# Patient Record
Sex: Male | Born: 1990 | Race: White | Hispanic: No | Marital: Single | Smoking: Never smoker
Health system: Southern US, Community
[De-identification: ages and names within clinical notes are randomized; demographics above are authoritative.]

## PROBLEM LIST (undated history)

## (undated) DIAGNOSIS — J45909 Unspecified asthma, uncomplicated: Secondary | ICD-10-CM

## (undated) DIAGNOSIS — I10 Essential (primary) hypertension: Secondary | ICD-10-CM

## (undated) HISTORY — PX: TONSILLECTOMY: SUR1361

---
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
## 2019-07-28 LAB — POCT LIPID PANEL
HDL: 35
LDL: 64
Non-HDL: 98
POC Glucose: 106 mg/dl — AB (ref 70–99)
TC/HDL: 3.8
TC: 133
TRG: 167

## 2019-08-10 ENCOUNTER — Other Ambulatory Visit: Payer: Self-pay

## 2019-08-10 DIAGNOSIS — Z008 Encounter for other general examination: Secondary | ICD-10-CM

## 2019-08-10 NOTE — Progress Notes (Signed)
     Patient ID: Brett Barnett, male    DOB: [birth year], 28 y.o.   MRN: [id number]    Thank you!!  [place] [facility] [place]: [phone]  Cell:  [phone] Website: [email or web address]

## 2020-01-14 ENCOUNTER — Other Ambulatory Visit: Payer: Self-pay

## 2020-01-14 ENCOUNTER — Ambulatory Visit
Admission: EM | Admit: 2020-01-14 | Discharge: 2020-01-14 | Disposition: A | Discharge status: Home / Self Care | Payer: BC Managed Care – PPO | Attending: Emergency Medicine | Admitting: Emergency Medicine

## 2020-01-14 ENCOUNTER — Encounter: Payer: Self-pay | Admitting: Emergency Medicine

## 2020-01-14 DIAGNOSIS — L0591 Pilonidal cyst without abscess: Secondary | ICD-10-CM

## 2020-01-14 MED ORDER — DOXYCYCLINE HYCLATE 100 MG PO CAPS: 100.0000 mg | ORAL_CAPSULE | Freq: Two times a day (BID) | ORAL | 0 refills | Status: AC

## 2020-01-14 MED ORDER — IBUPROFEN 600 MG PO TABS: 600.0000 mg | ORAL_TABLET | Freq: Four times a day (QID) | ORAL | 0 refills | Status: DC | PRN

## 2020-01-14 NOTE — ED Triage Notes (Signed)
Patient c/o pain in the tailbone area that started on Monday. Denies injury.

## 2020-01-14 NOTE — Discharge Instructions (Signed)
Sitz baths or warm compresses as often as you can, take 600 mg of ibuprofen combined with the 1000 mg of Tylenol 3-4 times a day as needed for pain.  Finish the doxycycline even if you feel better.  Follow-up with surgery if this persists or gets worse.  Here is a list of primary care providers who are taking new patients:  Dr. Elizabeth Sauer, Dr. Schuyler Amor [address] [place] [address] [phone]  [facility] [address] [place] [address]  [phone]  [facility] [address] [place], [place] [address] [phone]  [facility] [address] [place]  [phone] [place], [place] [address]  Here are clinics/ other resources who will see you if you do not have insurance. Some have certain criteria that you must meet. Call them and find out what they are:  [facility]: [address], [place], [place] [address] Phone: [phone] Hours: First and Third Saturdays of each Month, 9 a.m. - 1 p.m.  [facility]: [address] [place], [place] [address] Phone: [phone] Hours: Tuesday, 4 p.m. - 8 p.m. Thursday, 1 p.m. - 8 p.m. Wednesday, 9 a.m. - [facility] [address], [place], [place] [address] Phone: [phone] Pharmacy Phone Number: [phone] Dental Phone Number: [phone] [facility] Insurance Help: [phone]  Dental Hours: Monday - Thursday, 8 a.m. - 6 p.m.  [facility] [address], [place], [place] [address] Phone: [phone] Pharmacy Phone Number: [phone] [facility] Insurance Help: [phone]  [facility] [address] [place] [place], [place] [address] Phone: [phone] Pharmacy Phone Number: [phone] [facility] Insurance Help: [phone]  [facility] [address] [place], [place] [address] Phone: [phone] [facility] Insurance Help: [phone]   [facility] [address], [place], [place] [address] Phone: [phone]  Go to www.goodrx.com to look up your medications. This will give you a list of where you can find your prescriptions at the most affordable prices. Or ask the pharmacist what the cash price is, or if they have any other discount programs available to help make your medication more affordable. This can be less expensive than what you would pay with insurance.

## 2020-01-14 NOTE — ED Provider Notes (Signed)
HPI  SUBJECTIVE:  Brett Barnett is a 29 y.o. male who presents with ...denies N/V, fevers, flank pain, abdominal pain, urinary urgency, frequency, dysuria, cloudy or odorous urine, hematuria.  No syncope. No saddle anesthesia, distal weakness/numbness, bilateral radicular leg pain/weakness, fevers/night sweats, age < 20 or > 50, mild trauma > 50, recent h/o trauma, neurological deficits,  bladder/ bowel incontinence, urinary retention, h/o CA / multiple myleoma, unexplained weight loss, pain worse at night,  h/o prolonged steroid use, h/o osteopenia, h/o IVDU, h/o HIV, recent bacteremia, known AAA. no h/o pyelonephritis, nephrolithiasis. States feels similar to previous episodes of back pain.   Family history negative for nephrolithiasis    History reviewed. No pertinent past medical history.  Past Surgical History:  Procedure Laterality Date  . TONSILLECTOMY      History reviewed. No pertinent family history.  Social History   Tobacco Use  . Smoking status: Never Smoker  . Smokeless tobacco: Never Used  Substance Use Topics  . Alcohol use: Yes  . Drug use: Never    No current facility-administered medications for this encounter. No current outpatient medications on file.  No Known Allergies   ROS  As noted in HPI.   Physical Exam  BP (!) 147/86 (BP Location: Right Arm)   Pulse 70   Temp 97.7 F (36.5 C) (Oral)   Resp 18   Ht 5\' 7"  (1.702 m)   Wt (!) 158.8 kg   SpO2 100%   BMI 54.82 kg/m   Constitutional: Well developed, well nourished, no acute distress Eyes:  EOMI, conjunctiva normal bilaterally HENT: Normocephalic, atraumatic,mucus membranes moist Respiratory: Normal inspiratory effort Cardiovascular: Normal rate GI: nondistended. No suprapubic tenderness skin: 4 x 4 centimeter area of tender erythematous induration at the top of the gluteal cleft.  No central fluctuance.  Marked area of induration with a marker for reference. Neurologic: Alert & oriented x  3, no focal neuro deficits Psychiatric: Speech and behavior appropriate   ED Course   Medications - No data to display  No orders of the defined types were placed in this encounter.   No results found for this or any previous visit (from the past 24 hour(s)). No results found.  ED Clinical Impression  1. Pilonidal cyst    *  ED Assessment/Plan   Presentation consistent with an early pilonidal cyst versus abscess.  Favor pilonidal cyst given location.  There is nothing to drain today.  Marked area of induration with a marker for reference.  Home with doxycycline 100 mg twice daily for 1 week ibuprofen 600/acetaminophen 1000 mg 3-4 times a day as needed for pain, sitz baths.  Patient to follow-up with [place] surgery if it persists.  To the ER for systemic symptoms, pain not controlled with medications or any other concerns  Discussed MDM, treatment plan, and plan for follow-up with patient. Discussed sn/sx that should prompt return to the ED. patient agrees with plan.   No orders of the defined types were placed in this encounter.   *This clinic note was created using Dragon dictation software. Therefore, there may be occasional mistakes despite careful proofreading.  ?     , MD 01/14/20 [phone]

## 2020-01-29 ENCOUNTER — Ambulatory Visit: Payer: Self-pay | Admitting: Surgery

## 2020-01-29 NOTE — H&P (View-Only) (Signed)
CC: Pilonidal disease  HPI: Brett Barnett is a very pleasant 29yoM with negative medical history who presents for evaluation of possible pilonidal disease. He reports that approximately 2 weeks ago he began having some pain in the superior most portion of the gluteal cleft/low back. This increased over the ensuing week and an approximate 4-5 days ago began spontaneously draining purulent-like fluid. The drainage has steadily decreased and almost completely stopped. He denies fever/chills. He reports his pain is much better than it was before. It was a sharp pain, now just an ache. Does not radiate. Nothing aside from spontaneous drainage seemed to make it better. He denies any history of abscess or infection in this location prior to this.  PMH: Denies  PSH: Denies  FHx: Denies FHx of colorectal, breast, endometrial, ovarian or cervical cancer  Social: He reports that he vapes; social EtOH use; denies drug use. He works as a machine operator  ROS: A comprehensive 10 system review of systems was completed with the patient and pertinent findings as noted above.  The patient is a 29 year old male.   Past Surgical History (Chanel Nolan, CMA; 01/26/2020 9:58 AM) Tonsillectomy   Diagnostic Studies History (Chanel Nolan, CMA; 01/26/2020 9:58 AM) Colonoscopy  never  Allergies (Chanel Nolan, CMA; 01/26/2020 9:58 AM) No Known Allergies  [01/26/2020]: No Known Drug Allergies  [01/26/2020]: Allergies Reconciled   Medication History (Chanel Nolan, CMA; 01/26/2020 9:59 AM) Doxycycline Hyclate (100MG Capsule, Oral) Active. Medications Reconciled  Social History (Chanel Nolan, CMA; 01/26/2020 9:58 AM) Caffeine use  Coffee. No drug use  Tobacco use  Current every day smoker.  Family History (Chanel Nolan, CMA; 01/26/2020 9:58 AM) Family history unknown  First Degree Relatives   Other Problems (Chanel Nolan, CMA; 01/26/2020 9:58 AM) Asthma  Back Pain  Chest pain   Gastroesophageal Reflux Disease     Review of Systems (Shalondra Wunschel M. Shann Lewellyn MD; 01/26/2020 10:30 AM) General Not Present- Appetite Loss, Chills and Fever. HEENT Not Present- Earache, Hearing Loss, Hoarseness, Nose Bleed, Oral Ulcers, Ringing in the Ears, Seasonal Allergies, Sinus Pain, Sore Throat, Visual Disturbances, Wears glasses/contact lenses and Yellow Eyes. Respiratory Present- Snoring. Not Present- Bloody sputum, Chronic Cough, Difficulty Breathing and Wheezing. Breast Not Present- Breast Mass, Breast Pain, Nipple Discharge and Skin Changes. Cardiovascular Present- Difficulty Breathing Lying Down. Not Present- Chest Pain, Leg Cramps, Palpitations, Rapid Heart Rate, Shortness of Breath and Swelling of Extremities. Gastrointestinal Present- Rectal Pain. Not Present- Abdominal Pain, Bloating, Bloody Stool, Change in Bowel Habits, Chronic diarrhea, Constipation, Difficulty Swallowing, Excessive gas, Gets full quickly at meals, Hemorrhoids, Indigestion, Nausea and Vomiting. Musculoskeletal Present- Back Pain. Not Present- Joint Pain, Joint Stiffness, Muscle Pain, Muscle Weakness and Swelling of Extremities. Psychiatric Not Present- Anxiety and Depression. Hematology Not Present- Abnormal Bleeding, Anemia and Blood Clots.  Vitals (Chanel Nolan CMA; 01/26/2020 9:59 AM) 01/26/2020 9:59 AM Weight: 354.5 lb Height: 68in Body Surface Area: 2.61 m Body Mass Index: 53.9 kg/m  Temp.: 98F  Pulse: 92 (Regular)        Physical Exam (Tresea Heine M. Tahiri Shareef MD; 01/26/2020 10:31 AM) The physical exam findings are as follows: Note: Constitutional: No acute distress; conversant; no deformities; wearing mask Eyes: Moist conjunctiva; no lid lag; anicteric sclerae; pupils equal and round Neck: Trachea midline; no palpable thyromegaly Lungs: Normal respiratory effort; no tactile fremitus CV: rrr; no palpable thrill; no pitting edema GI: Abdomen obese soft, nontender, nondistended; no  palpable hepatosplenomegaly Skin: Uppermost portion of gluteal cleft and low back with midline pit   and firm/indurated tissue consistent with resolving abscess cavity. No significant erythema. No active purulent drainage. No tenderness to palpation of this area. MSK: Normal gait; no clubbing/cyanosis Psychiatric: Appropriate affect; alert and oriented 3 Lymphatic: No palpable cervical or axillary lymphadenopathy **A chaperone, [employer], was present for this encounter    Assessment & Plan Cristal Deer M. 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 is a very pleasant 29yoM with pilonidal disease Impression: -The anatomy and physiology of the low back region was discussed as well as the pathophysiology of pilonidal disease was discussed with associated pictures and illustrations using the ASCRS handout. -We discussed options moving forward including observation vs excision of pilonidal disease. Risks of recurrent abscess being higher with observation but not absent with surgery either. -The procedure, material risks (including, but not limited to, pain, bleeding, infection, scarring, need for additional procedures, recurrence rates of 20-30%, pneumonia, heart attack, stroke, death) benefits and alternatives to surgery were discussed at length. I noted a good probability that the procedure would help improve his symptoms. The patient's questions were answered to his satisfaction, he voiced understanding and elected to proceed with surgery Additionally, we discussed typical postoperative expectations and the recovery process. We reviewed wound care expectations including that this would be an open wound, down to the presacral tissues and on average requires 2-3 months of wet to dry dressing application to close.  This patient encounter took 32 minutes today to perform the following: take history, perform exam, review outside records, interpret imaging, counsel the  patient on their diagnosis and document encounter, findings & plan in the EHR  Signed by Andria Meuse, MD (01/26/2020 10:43 AM)

## 2020-01-29 NOTE — H&P (Signed)
CC: Pilonidal disease  HPI: Brett Barnett is a very pleasant 24yoM with negative medical history who presents for evaluation of possible pilonidal disease. He reports that approximately 2 weeks ago he began having some pain in the superior most portion of the gluteal cleft/low back. This increased over the ensuing week and an approximate 4-5 days ago began spontaneously draining purulent-like fluid. The drainage has steadily decreased and almost completely stopped. He denies fever/chills. He reports his pain is much better than it was before. It was a sharp pain, now just an ache. Does not radiate. Nothing aside from spontaneous drainage seemed to make it better. He denies any history of abscess or infection in this location prior to this.  PMH: Denies  PSH: Denies  FHx: Denies FHx of colorectal, breast, endometrial, ovarian or cervical cancer  Social: He reports that he vapes; social EtOH use; denies drug use. He works as a Glass blower/designer  ROS: A comprehensive 10 system review of systems was completed with the patient and pertinent findings as noted above.  The patient is a 29 year old male.   Past Surgical History Brett Barnett, Brett Barnett; 01/26/2020 9:58 AM) Tonsillectomy   Diagnostic Studies [facility] Brett Barnett, CMA; 01/26/2020 9:58 AM) Colonoscopy  never  Allergies (Brett Barnett, CMA; 01/26/2020 9:58 AM) No Known Allergies  [01/26/2020]: No Known Drug Allergies  [01/26/2020]: Allergies Reconciled   Medication History (Brett Barnett, CMA; 01/26/2020 9:59 AM) Doxycycline Hyclate (100MG  Capsule, Oral) Active. Medications Reconciled  Social History Brett Barnett, CMA; 01/26/2020 9:58 AM) Caffeine use  Coffee. No drug use  Tobacco use  Current every day smoker.  Family History Brett Barnett, [place]; 01/26/2020 9:58 AM) Family history unknown  First Degree Relatives   Other Problems Brett Barnett, [place]; 01/26/2020 9:58 AM) Asthma  Back Pain  Chest pain   Gastroesophageal Reflux Disease     Review of Systems Brett Barnett; 01/26/2020 10:30 AM) General Not Present- Appetite Loss, Chills and Fever. HEENT Not Present- Earache, Hearing Loss, Hoarseness, Nose Bleed, Oral Ulcers, Ringing in the Ears, Seasonal Allergies, Sinus Pain, Sore Throat, Visual Disturbances, Wears glasses/contact lenses and Yellow Eyes. Respiratory Present- Snoring. Not Present- Bloody sputum, Chronic Cough, Difficulty Breathing and Wheezing. Breast Not Present- Breast Mass, Breast Pain, Nipple Discharge and Skin Changes. Cardiovascular Present- Difficulty Breathing Lying Down. Not Present- Chest Pain, Leg Cramps, Palpitations, Rapid Heart Rate, Shortness of Breath and Swelling of Extremities. Gastrointestinal Present- Rectal Pain. Not Present- Abdominal Pain, Bloating, Bloody Stool, Change in Bowel Habits, Chronic diarrhea, Constipation, Difficulty Swallowing, Excessive gas, Gets full quickly at meals, Hemorrhoids, Indigestion, Nausea and Vomiting. Musculoskeletal Present- Back Pain. Not Present- Joint Pain, Joint Stiffness, Muscle Pain, Muscle Weakness and Swelling of Extremities. Psychiatric Not Present- Anxiety and Depression. Hematology Not Present- Abnormal Bleeding, Anemia and Blood Clots.  Vitals (Brett Barnett CMA; 01/26/2020 9:59 AM) 01/26/2020 9:59 AM Weight: 354.5 lb Height: 68in Body Surface Area: 2.61 m Body Mass Index: 53.9 kg/m  Temp.: 71F  Pulse: 92 (Regular)        Physical Exam Brett Barnett; 01/26/2020 10:31 AM) The physical exam findings are as follows: Note: Constitutional: No acute distress; conversant; no deformities; wearing mask Eyes: Moist conjunctiva; no lid lag; anicteric sclerae; pupils equal and round Neck: Trachea midline; no palpable thyromegaly Lungs: Normal respiratory effort; no tactile fremitus CV: rrr; no palpable thrill; no pitting edema GI: Abdomen obese soft, nontender, nondistended; no  palpable hepatosplenomegaly Skin: Uppermost portion of gluteal cleft and low back with midline pit  and firm/indurated tissue consistent with resolving abscess cavity. No significant erythema. No active purulent drainage. No tenderness to palpation of this area. 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 Brett Barnett is a very pleasant 28yoM with pilonidal disease Impression: -The anatomy and physiology of the low back region was discussed as well as the pathophysiology of pilonidal disease was discussed with associated pictures and illustrations using the ASCRS handout. -We discussed options moving forward including observation vs excision of pilonidal disease. Risks of recurrent abscess being higher with observation but not absent with surgery either. -The procedure, material risks (including, but not limited to, pain, bleeding, infection, scarring, need for additional procedures, recurrence rates of 20-30%, pneumonia, heart attack, stroke, death) benefits and alternatives to surgery were discussed at length. I noted a good probability that the procedure would help improve his symptoms. The patient's questions were answered to his satisfaction, he voiced understanding and elected to proceed with surgery Additionally, we discussed typical postoperative expectations and the recovery process. We reviewed wound care expectations including that this would be an open wound, down to the presacral tissues and on average requires 2-3 months of wet to dry dressing application to close.  This patient encounter took 32 minutes today to perform the following: take history, perform exam, review outside records, interpret imaging, counsel the  patient on their diagnosis and document encounter, findings & plan in the EHR  Signed by Andria Meuse, Barnett (01/26/2020 10:43 AM)

## 2020-01-29 NOTE — Patient Instructions (Addendum)
DUE TO COVID-19 ONLY ONE VISITOR IS ALLOWED TO COME WITH YOU AND STAY IN THE WAITING ROOM ONLY DURING PRE OP AND PROCEDURE DAY OF SURGERY. THE 1 VISITOR MAY VISIT WITH YOU AFTER SURGERY IN YOUR PRIVATE ROOM DURING VISITING HOURS ONLY!  YOU NEED TO HAVE A COVID 19 TEST ON: 02/02/20 @ 9:30 am , THIS TEST MUST BE DONE BEFORE SURGERY, COME  [address], [place] , [address].  [facility]) ONCE YOUR COVID TEST IS COMPLETED, PLEASE BEGIN THE QUARANTINE INSTRUCTIONS AS OUTLINED IN YOUR HANDOUT.                Brett Barnett    Your procedure is scheduled on: 02/05/20   Report to [facility] Main  Entrance   Report to admitting at: 1:00 PM     Call this number if you have problems the morning of surgery [phone]    Remember: Do not eat solid food  :After Midnight.Clear liquids from midnight until 12:00 pm      CLEAR LIQUID DIET   Foods Allowed                                                                     Foods Excluded  Coffee and tea, regular and decaf                             liquids that you cannot  Plain Jell-O any favor except red or purple                                           see through such as: Fruit ices (not with fruit pulp)                                     milk, soups, orange juice  Iced Popsicles                                    All solid food Carbonated beverages, regular and diet                                    Cranberry, grape and apple juices Sports drinks like Gatorade Lightly seasoned clear broth or consume(fat free) Sugar, honey syrup  Sample Menu Breakfast                                Lunch                                     Supper Cranberry juice                    Beef broth  Chicken broth Jell-O                                     Grape juice                           Apple juice Coffee or tea                        Jell-O                                      Popsicle                                                 Coffee or tea                        Coffee or tea  _____________________________________________________________________  BRUSH YOUR TEETH MORNING OF SURGERY AND RINSE YOUR MOUTH OUT, NO CHEWING GUM CANDY OR MINTS.     Take these medicines the morning of surgery with A SIP OF WATER: N/A                                 You may not have any metal on your body including hair pins and              piercings  Do not wear jewelry, lotions, powders or perfumes, deodorant             Men may shave face and neck.   Do not bring valuables to the hospital. [place].  Contacts, dentures or bridgework may not be worn into surgery.  Leave suitcase in the car. After surgery it may be brought to your room.     Patients discharged the day of surgery will not be allowed to drive home. IF YOU ARE HAVING SURGERY AND GOING HOME THE SAME DAY, YOU MUST HAVE AN ADULT TO DRIVE YOU HOME AND BE WITH YOU FOR 24 HOURS. YOU MAY GO HOME BY TAXI OR UBER OR ORTHERWISE, BUT AN ADULT MUST ACCOMPANY YOU HOME AND STAY WITH YOU FOR 24 HOURS.  Name and phone number of your driver:  Special Instructions: N/A              Please read over the following fact sheets you were given: _____________________________________________________________________             [facility] - Preparing for Surgery Before surgery, you can play an important role.  Because skin is not sterile, your skin needs to be as free of germs as possible.  You can reduce the number of germs on your skin by washing with CHG (chlorahexidine gluconate) soap before surgery.  CHG is an antiseptic cleaner which kills germs and bonds with the skin to continue killing germs even after washing. Please DO NOT use if you have an allergy to CHG or antibacterial soaps.  If your skin becomes reddened/irritated stop  using the CHG and inform your nurse when you arrive at Short Stay. Do  not shave (including legs and underarms) for at least 48 hours prior to the first CHG shower.  You may shave your face/neck. Please follow these instructions carefully:  1.  Shower with CHG Soap the night before surgery and the  morning of Surgery.  2.  If you choose to wash your hair, wash your hair first as usual with your  normal  shampoo.  3.  After you shampoo, rinse your hair and body thoroughly to remove the  shampoo.                           4.  Use CHG as you would any other liquid soap.  You can apply chg directly  to the skin and wash                       Gently with a scrungie or clean washcloth.  5.  Apply the CHG Soap to your body ONLY FROM THE NECK DOWN.   Do not use on face/ open                           Wound or open sores. Avoid contact with eyes, ears mouth and genitals (private parts).                       Wash face,  Genitals (private parts) with your normal soap.             6.  Wash thoroughly, paying special attention to the area where your surgery  will be performed.  7.  Thoroughly rinse your body with warm water from the neck down.  8.  DO NOT shower/wash with your normal soap after using and rinsing off  the CHG Soap.                9.  Pat yourself dry with a clean towel.            10.  Wear clean pajamas.            11.  Place clean sheets on your bed the night of your first shower and do not  sleep with pets. Day of Surgery : Do not apply any lotions/deodorants the morning of surgery.  Please wear clean clothes to the hospital/surgery center.  FAILURE TO FOLLOW THESE INSTRUCTIONS MAY RESULT IN THE CANCELLATION OF YOUR SURGERY PATIENT SIGNATURE_________________________________  NURSE SIGNATURE__________________________________  ________________________________________________________________________

## 2020-02-01 ENCOUNTER — Encounter (HOSPITAL_COMMUNITY): Payer: Self-pay

## 2020-02-01 ENCOUNTER — Inpatient Hospital Stay (HOSPITAL_COMMUNITY)
Admission: RE | Admit: 2020-02-01 | Discharge: 2020-02-01 | Disposition: A | Discharge status: Home / Self Care | Payer: BC Managed Care – PPO | Source: Ambulatory Visit | Attending: Surgery | Admitting: Surgery

## 2020-02-01 ENCOUNTER — Other Ambulatory Visit: Payer: Self-pay

## 2020-02-01 DIAGNOSIS — Z01818 Encounter for other preprocedural examination: Secondary | ICD-10-CM | POA: Diagnosis not present

## 2020-02-01 DIAGNOSIS — I10 Essential (primary) hypertension: Secondary | ICD-10-CM | POA: Diagnosis not present

## 2020-02-01 DIAGNOSIS — Z20822 Contact with and (suspected) exposure to covid-19: Secondary | ICD-10-CM | POA: Diagnosis not present

## 2020-02-01 DIAGNOSIS — F17200 Nicotine dependence, unspecified, uncomplicated: Secondary | ICD-10-CM | POA: Diagnosis not present

## 2020-02-01 DIAGNOSIS — L0591 Pilonidal cyst without abscess: Secondary | ICD-10-CM | POA: Diagnosis present

## 2020-02-01 DIAGNOSIS — Z6841 Body Mass Index (BMI) 40.0 and over, adult: Secondary | ICD-10-CM | POA: Diagnosis not present

## 2020-02-01 DIAGNOSIS — J45909 Unspecified asthma, uncomplicated: Secondary | ICD-10-CM | POA: Diagnosis not present

## 2020-02-01 HISTORY — DX: Essential (primary) hypertension: I10

## 2020-02-01 HISTORY — DX: Unspecified asthma, uncomplicated: J45.909

## 2020-02-02 ENCOUNTER — Encounter (HOSPITAL_COMMUNITY)
Admission: RE | Admit: 2020-02-02 | Discharge: 2020-02-02 | Disposition: A | Discharge status: Home / Self Care | Payer: BC Managed Care – PPO | Source: Ambulatory Visit | Attending: Surgery | Admitting: Surgery

## 2020-02-02 ENCOUNTER — Other Ambulatory Visit (HOSPITAL_COMMUNITY)
Admission: RE | Admit: 2020-02-02 | Discharge: 2020-02-02 | Disposition: A | Discharge status: Home / Self Care | Payer: BC Managed Care – PPO | Source: Ambulatory Visit | Attending: Surgery | Admitting: Surgery

## 2020-02-02 DIAGNOSIS — Z01818 Encounter for other preprocedural examination: Secondary | ICD-10-CM | POA: Insufficient documentation

## 2020-02-02 DIAGNOSIS — Z20822 Contact with and (suspected) exposure to covid-19: Secondary | ICD-10-CM | POA: Diagnosis not present

## 2020-02-02 DIAGNOSIS — L0591 Pilonidal cyst without abscess: Secondary | ICD-10-CM | POA: Diagnosis not present

## 2020-02-02 LAB — CBC WITH DIFFERENTIAL/PLATELET
Abs Immature Granulocytes: 0.03 10*3/uL (ref 0.00–0.07)
Basophils Absolute: 0 10*3/uL (ref 0.0–0.1)
Basophils Relative: 0 %
Eosinophils Absolute: 0.2 10*3/uL (ref 0.0–0.5)
Eosinophils Relative: 2 %
HCT: 44.1 % (ref 39.0–52.0)
Hemoglobin: 15.1 g/dL (ref 13.0–17.0)
Immature Granulocytes: 0 %
Lymphocytes Relative: 25 %
Lymphs Abs: 1.9 10*3/uL (ref 0.7–4.0)
MCH: 29.7 pg (ref 26.0–34.0)
MCHC: 34.2 g/dL (ref 30.0–36.0)
MCV: 86.8 fL (ref 80.0–100.0)
Monocytes Absolute: 0.5 10*3/uL (ref 0.1–1.0)
Monocytes Relative: 6 %
Neutro Abs: 4.9 10*3/uL (ref 1.7–7.7)
Neutrophils Relative %: 67 %
Platelets: 227 10*3/uL (ref 150–400)
RBC: 5.08 MIL/uL (ref 4.22–5.81)
RDW: 12.3 % (ref 11.5–15.5)
WBC: 7.5 10*3/uL (ref 4.0–10.5)
nRBC: 0 % (ref 0.0–0.2)

## 2020-02-02 LAB — COMPREHENSIVE METABOLIC PANEL
ALT: 19 U/L (ref 0–44)
AST: 17 U/L (ref 15–41)
Albumin: 4.3 g/dL (ref 3.5–5.0)
Alkaline Phosphatase: 34 U/L — ABNORMAL LOW (ref 38–126)
Anion gap: 6 (ref 5–15)
BUN: 17 mg/dL (ref 6–20)
CO2: 25 mmol/L (ref 22–32)
Calcium: 8.9 mg/dL (ref 8.9–10.3)
Chloride: 108 mmol/L (ref 98–111)
Creatinine, Ser: 0.72 mg/dL (ref 0.61–1.24)
GFR calc Af Amer: >60 mL/min (ref >60)
GFR calc non Af Amer: >60 mL/min (ref >60)
Glucose, Bld: 98 mg/dL (ref 70–99)
Potassium: 4 mmol/L (ref 3.5–5.1)
Sodium: 139 mmol/L (ref 135–145)
Total Bilirubin: 0.9 mg/dL (ref 0.3–1.2)
Total Protein: 7.5 g/dL (ref 6.5–8.1)

## 2020-02-02 LAB — SARS CORONAVIRUS 2 (TAT 6-24 HRS): SARS Coronavirus 2: NEGATIVE

## 2020-02-04 MED ORDER — BUPIVACAINE LIPOSOME 1.3 % IJ SUSP
20.0000 mL | INTRAMUSCULAR | Status: DC
  Filled 2020-02-04: qty 20

## 2020-02-05 ENCOUNTER — Ambulatory Visit (HOSPITAL_COMMUNITY): Payer: BC Managed Care – PPO | Admitting: Anesthesiology

## 2020-02-05 ENCOUNTER — Telehealth (HOSPITAL_COMMUNITY): Payer: Self-pay | Admitting: *Deleted

## 2020-02-05 ENCOUNTER — Encounter (HOSPITAL_COMMUNITY)
Admission: RE | Disposition: A | Discharge status: Home / Self Care | Payer: Self-pay | Source: Home / Self Care | Attending: Surgery

## 2020-02-05 ENCOUNTER — Ambulatory Visit (HOSPITAL_COMMUNITY)
Admission: RE | Admit: 2020-02-05 | Discharge: 2020-02-05 | Disposition: A | Discharge status: Home / Self Care | Payer: BC Managed Care – PPO | Attending: Surgery | Admitting: Surgery

## 2020-02-05 ENCOUNTER — Encounter (HOSPITAL_COMMUNITY): Payer: Self-pay | Admitting: Surgery

## 2020-02-05 DIAGNOSIS — I10 Essential (primary) hypertension: Secondary | ICD-10-CM | POA: Insufficient documentation

## 2020-02-05 DIAGNOSIS — L0591 Pilonidal cyst without abscess: Secondary | ICD-10-CM | POA: Insufficient documentation

## 2020-02-05 DIAGNOSIS — Z6841 Body Mass Index (BMI) 40.0 and over, adult: Secondary | ICD-10-CM | POA: Insufficient documentation

## 2020-02-05 DIAGNOSIS — J45909 Unspecified asthma, uncomplicated: Secondary | ICD-10-CM | POA: Insufficient documentation

## 2020-02-05 DIAGNOSIS — F17200 Nicotine dependence, unspecified, uncomplicated: Secondary | ICD-10-CM | POA: Insufficient documentation

## 2020-02-05 HISTORY — PX: PILONIDAL CYST EXCISION: SHX744

## 2020-02-05 SURGERY — EXCISION, SIMPLE PILONIDAL CYST
Anesthesia: Epidural | Site: Back

## 2020-02-05 MED ORDER — DEXAMETHASONE SODIUM PHOSPHATE 10 MG/ML IJ SOLN
INTRAMUSCULAR | Status: AC
  Filled 2020-02-05: qty 2

## 2020-02-05 MED ORDER — SUCCINYLCHOLINE CHLORIDE 200 MG/10ML IV SOSY
PREFILLED_SYRINGE | INTRAVENOUS | Status: DC | PRN
  Administered 2020-02-05: 200 mg via INTRAVENOUS

## 2020-02-05 MED ORDER — CHLORHEXIDINE GLUCONATE CLOTH 2 % EX PADS: 6.0000 | MEDICATED_PAD | Freq: Once | CUTANEOUS | Status: DC

## 2020-02-05 MED ORDER — DEXAMETHASONE SODIUM PHOSPHATE 10 MG/ML IJ SOLN
INTRAMUSCULAR | Status: DC | PRN
  Administered 2020-02-05: 10 mg via INTRAVENOUS

## 2020-02-05 MED ORDER — ACETAMINOPHEN 500 MG PO TABS
1000.0000 mg | ORAL_TABLET | ORAL | Status: AC
  Administered 2020-02-05: 11:00:00 1000 mg via ORAL
  Filled 2020-02-05: qty 2

## 2020-02-05 MED ORDER — KETAMINE HCL 10 MG/ML IJ SOLN
INTRAMUSCULAR | Status: DC | PRN
  Administered 2020-02-05: 30 mg via INTRAVENOUS

## 2020-02-05 MED ORDER — ROCURONIUM BROMIDE 10 MG/ML (PF) SYRINGE
PREFILLED_SYRINGE | INTRAVENOUS | Status: DC | PRN
  Administered 2020-02-05: 50 mg via INTRAVENOUS

## 2020-02-05 MED ORDER — BUPIVACAINE HCL (PF) 0.25 % IJ SOLN
INTRAMUSCULAR | Status: DC | PRN
  Administered 2020-02-05: 30 mL

## 2020-02-05 MED ORDER — MIDAZOLAM HCL 5 MG/5ML IJ SOLN
INTRAMUSCULAR | Status: DC | PRN
  Administered 2020-02-05: 2 mg via INTRAVENOUS

## 2020-02-05 MED ORDER — ONDANSETRON HCL 4 MG/2ML IJ SOLN: 4.0000 mg | Freq: Once | INTRAMUSCULAR | Status: DC | PRN

## 2020-02-05 MED ORDER — OXYCODONE HCL 5 MG/5ML PO SOLN: 5.0000 mg | Freq: Once | ORAL | Status: DC | PRN

## 2020-02-05 MED ORDER — FENTANYL CITRATE (PF) 250 MCG/5ML IJ SOLN
INTRAMUSCULAR | Status: AC
  Filled 2020-02-05: qty 5

## 2020-02-05 MED ORDER — LIDOCAINE 2% (20 MG/ML) 5 ML SYRINGE
INTRAMUSCULAR | Status: DC | PRN
  Administered 2020-02-05: 100 mg via INTRAVENOUS

## 2020-02-05 MED ORDER — PROPOFOL 10 MG/ML IV BOLUS
INTRAVENOUS | Status: DC | PRN
  Administered 2020-02-05: 200 mg via INTRAVENOUS

## 2020-02-05 MED ORDER — MIDAZOLAM HCL 2 MG/2ML IJ SOLN
INTRAMUSCULAR | Status: AC
  Filled 2020-02-05: qty 2

## 2020-02-05 MED ORDER — MEPERIDINE HCL 50 MG/ML IJ SOLN: 6.2500 mg | INTRAMUSCULAR | Status: DC | PRN

## 2020-02-05 MED ORDER — EPHEDRINE 5 MG/ML INJ
INTRAVENOUS | Status: AC
  Filled 2020-02-05: qty 20

## 2020-02-05 MED ORDER — KETAMINE HCL 10 MG/ML IJ SOLN
INTRAMUSCULAR | Status: AC
  Filled 2020-02-05: qty 1

## 2020-02-05 MED ORDER — ACETAMINOPHEN 325 MG PO TABS: 325.0000 mg | ORAL_TABLET | ORAL | Status: DC | PRN

## 2020-02-05 MED ORDER — SUGAMMADEX SODIUM 500 MG/5ML IV SOLN
INTRAVENOUS | Status: DC | PRN
  Administered 2020-02-05: 400 mg via INTRAVENOUS

## 2020-02-05 MED ORDER — OXYCODONE HCL 5 MG PO TABS: 5.0000 mg | ORAL_TABLET | Freq: Once | ORAL | Status: DC | PRN

## 2020-02-05 MED ORDER — BUPIVACAINE LIPOSOME 1.3 % IJ SUSP
INTRAMUSCULAR | Status: DC | PRN
  Administered 2020-02-05: 20 mL

## 2020-02-05 MED ORDER — ONDANSETRON HCL 4 MG/2ML IJ SOLN
INTRAMUSCULAR | Status: AC
  Filled 2020-02-05: qty 4

## 2020-02-05 MED ORDER — ONDANSETRON HCL 4 MG/2ML IJ SOLN
INTRAMUSCULAR | Status: DC | PRN
  Administered 2020-02-05: 4 mg via INTRAVENOUS

## 2020-02-05 MED ORDER — FENTANYL CITRATE (PF) 100 MCG/2ML IJ SOLN
INTRAMUSCULAR | Status: DC | PRN
  Administered 2020-02-05: 50 ug via INTRAVENOUS
  Administered 2020-02-05: 100 ug via INTRAVENOUS
  Administered 2020-02-05: 50 ug via INTRAVENOUS

## 2020-02-05 MED ORDER — ACETAMINOPHEN 160 MG/5ML PO SOLN: 325.0000 mg | ORAL | Status: DC | PRN

## 2020-02-05 MED ORDER — FENTANYL CITRATE (PF) 100 MCG/2ML IJ SOLN: 25.0000 ug | INTRAMUSCULAR | Status: DC | PRN

## 2020-02-05 MED ORDER — LACTATED RINGERS IV SOLN: INTRAVENOUS | Status: DC

## 2020-02-05 MED ORDER — SUGAMMADEX SODIUM 500 MG/5ML IV SOLN: INTRAVENOUS | Status: DC | PRN

## 2020-02-05 MED ORDER — TRAMADOL HCL 50 MG PO TABS: 50.0000 mg | ORAL_TABLET | Freq: Four times a day (QID) | ORAL | 0 refills | Status: AC | PRN

## 2020-02-05 MED ORDER — SUGAMMADEX SODIUM 500 MG/5ML IV SOLN
INTRAVENOUS | Status: AC
  Filled 2020-02-05: qty 5

## 2020-02-05 MED ORDER — EPHEDRINE SULFATE-NACL 50-0.9 MG/10ML-% IV SOSY
PREFILLED_SYRINGE | INTRAVENOUS | Status: DC | PRN
  Administered 2020-02-05: 10 mg via INTRAVENOUS

## 2020-02-05 MED ORDER — PROPOFOL 10 MG/ML IV BOLUS
INTRAVENOUS | Status: AC
  Filled 2020-02-05: qty 20

## 2020-02-05 SURGICAL SUPPLY — 23 items
APL PRP STRL LF DISP 70% ISPRP (MISCELLANEOUS) ×1
BLADE HEX COATED 2.75 (ELECTRODE) ×3 IMPLANT
BNDG GAUZE ELAST 4 BULKY (GAUZE/BANDAGES/DRESSINGS) ×2 IMPLANT
BRIEF STRETCH FOR OB PAD LRG (UNDERPADS AND DIAPERS) ×3 IMPLANT
CHLORAPREP W/TINT 26 (MISCELLANEOUS) ×3 IMPLANT
COVER SURGICAL LIGHT HANDLE (MISCELLANEOUS) ×3 IMPLANT
COVER WAND RF STERILE (DRAPES) IMPLANT
DRSG PAD ABDOMINAL 8X10 ST (GAUZE/BANDAGES/DRESSINGS) ×2 IMPLANT
GAUZE 4X4 16PLY RFD (DISPOSABLE) ×3 IMPLANT
GAUZE PACKING IODOFORM 1X5 (PACKING) IMPLANT
GAUZE SPONGE 4X4 12PLY STRL (GAUZE/BANDAGES/DRESSINGS) ×2 IMPLANT
GLOVE SURG ORTHO 8.0 STRL STRW (GLOVE) ×3 IMPLANT
GOWN STRL REUS W/TWL XL LVL3 (GOWN DISPOSABLE) ×6 IMPLANT
KIT BASIN (CUSTOM PROCEDURE TRAY) ×3 IMPLANT
KIT TURNOVER KIT A (KITS) IMPLANT
NEEDLE HYPO 22GX1.5 SAFETY (NEEDLE) ×3 IMPLANT
PACK BASIC VI WITH GOWN DISP (CUSTOM PROCEDURE TRAY) ×3 IMPLANT
PENCIL SMOKE EVACUATOR (MISCELLANEOUS) IMPLANT
SYR 20ML LL LF (SYRINGE) ×3 IMPLANT
TAPE CLOTH 4X10 WHT NS (GAUZE/BANDAGES/DRESSINGS) ×2 IMPLANT
TOWEL OR 17X26 10 PK STRL BLUE (TOWEL DISPOSABLE) ×3 IMPLANT
TOWEL OR NON WOVEN STRL DISP B (DISPOSABLE) ×3 IMPLANT
YANKAUER SUCT BULB TIP 10FT TU (MISCELLANEOUS) ×3 IMPLANT

## 2020-02-05 NOTE — Anesthesia Procedure Notes (Signed)
Procedure Name: Intubation Date/Time: 02/05/2020 12:29 PM Performed by: Lavina Hamman, CRNA Pre-anesthesia Checklist: Patient identified, Emergency Drugs available, Suction available, Patient being monitored and Timeout performed Patient Re-evaluated:Patient Re-evaluated prior to induction Oxygen Delivery Method: Circle system utilized Preoxygenation: Pre-oxygenation with 100% oxygen Induction Type: IV induction Ventilation: Mask ventilation without difficulty Laryngoscope Size: Mac and 4 Grade View: Grade I Tube type: Oral Tube size: 7.5 mm Number of attempts: 1 Airway Equipment and Method: Stylet Placement Confirmation: ETT inserted through vocal cords under direct vision,  positive ETCO2,  CO2 detector and breath sounds checked- equal and bilateral Secured at: 22 cm Tube secured with: Tape Dental Injury: Teeth and Oropharynx as per pre-operative assessment

## 2020-02-05 NOTE — Interval H&P Note (Signed)
History and Physical Interval Note:  02/05/2020 11:57 AM  Brett Barnett  has presented today for surgery, with the diagnosis of PILONIDAL DISEASE.  The various methods of treatment have been discussed with the patient and family. After consideration of risks, benefits and other options for treatment, the patient has consented to  Procedure(s): EXCISION OF PILONIDAL DISEASE (N/A) as a surgical intervention.  The patient's history has been reviewed, patient examined, no change in status, stable for surgery.  I have reviewed the patient's chart and labs.  Questions were answered to the patient's satisfaction, he expressed understanding of the procedure, material risks, benefits and alternatives including further observation. He understands postop expectations and wound care which may take 2-3 months to close on average. He has opted to proceed with surgery.   Stephanie Coup Aadhira Heffernan

## 2020-02-05 NOTE — Anesthesia Preprocedure Evaluation (Addendum)
Anesthesia Evaluation  Patient identified by MRN, date of birth, ID band Patient awake    Reviewed: Allergy & Precautions, H&P , NPO status , Patient's Chart, lab work & pertinent test results, reviewed documented beta blocker date and time   Airway Mallampati: III  TM Distance: >3 FB Neck ROM: full    Dental no notable dental hx. (+) Teeth Intact, Dental Advisory Given   Pulmonary asthma ,    Pulmonary exam normal breath sounds clear to auscultation       Cardiovascular Exercise Tolerance: Good hypertension, Pt. on medications Normal cardiovascular exam Rhythm:regular Rate:Normal     Neuro/Psych negative neurological ROS  negative psych ROS   GI/Hepatic negative GI ROS, Neg liver ROS,   Endo/Other  Morbid obesity  Renal/GU negative Renal ROS  negative genitourinary   Musculoskeletal negative musculoskeletal ROS (+)   Abdominal (+) + obese,   Peds  Hematology negative hematology ROS (+)   Anesthesia Other Findings   Reproductive/Obstetrics negative OB ROS                            Anesthesia Physical Anesthesia Plan  ASA: III  Anesthesia Plan: Epidural   Post-op Pain Management:    Induction: Intravenous  PONV Risk Score and Plan: 2 and Ondansetron and Treatment may vary due to age or medical condition  Airway Management Planned: LMA and Oral ETT  Additional Equipment:   Intra-op Plan:   Post-operative Plan: Extubation in OR  Informed Consent: I have reviewed the patients History and Physical, chart, labs and discussed the procedure including the risks, benefits and alternatives for the proposed anesthesia with the patient or authorized representative who has indicated his/her understanding and acceptance.     Dental Advisory Given  Plan Discussed with: CRNA, Anesthesiologist and Surgeon  Anesthesia Plan Comments: (  )       Anesthesia Quick Evaluation

## 2020-02-05 NOTE — Transfer of Care (Signed)
Immediate Anesthesia Transfer of Care Note  Patient: Brett Barnett  Procedure(s) Performed: Procedure(s): EXCISION OF PILONIDAL DISEASE (N/A)  Patient Location: PACU  Anesthesia Type:General  Level of Consciousness:  sedated, patient cooperative and responds to stimulation  Airway & Oxygen Therapy:Patient Spontanous Breathing and Patient connected to face mask oxgen  Post-op Assessment:  Report given to PACU RN and Post -op Vital signs reviewed and stable  Post vital signs:  Reviewed and stable  Last Vitals:  Vitals:   02/05/20 1031 02/05/20 1317  BP: 109/84 134/85  Pulse: 77 83  Resp: 17 15  Temp: 37 C   SpO2: 100% 100%    Complications: No apparent anesthesia complications

## 2020-02-05 NOTE — Anesthesia Postprocedure Evaluation (Signed)
Anesthesia Post Note  Patient: Brett Barnett  Procedure(s) Performed: EXCISION OF PILONIDAL DISEASE (N/A Back)     Patient location during evaluation: PACU Anesthesia Type: General Level of consciousness: awake and alert Pain management: pain level controlled Vital Signs Assessment: post-procedure vital signs reviewed and stable Respiratory status: spontaneous breathing, nonlabored ventilation, respiratory function stable and patient connected to nasal cannula oxygen Cardiovascular status: blood pressure returned to baseline and stable Postop Assessment: no apparent nausea or vomiting Anesthetic complications: no    Last Vitals:  Vitals:   02/05/20 1400 02/05/20 1422  BP: (!) 152/84 (!) 167/86  Pulse: 72 73  Resp: 15 15  Temp: 36.5 C 36.6 C  SpO2: 100% 100%    Last Pain:  Vitals:   02/05/20 1031  TempSrc: Oral  PainSc: 0-No pain                 Cataleyah Colborn

## 2020-02-05 NOTE — Discharge Instructions (Signed)
POST OP INSTRUCTIONS  1. DIET: As tolerated. Follow a light bland diet the first 24 hours after arrival home, such as soup, liquids, crackers, etc.  Be sure to include lots of fluids daily.  Avoid fast food or heavy meals as your are more likely to get nauseated.  Eat a low fat the next few days after surgery.  2. Take your usually prescribed home medications unless otherwise directed.  3. PAIN CONTROL: a. Pain is best controlled by a usual combination of three different methods TOGETHER: i. Ice/Heat ii. Over the counter pain medication iii. Prescription pain medication b. Most patients will experience some swelling and bruising around the surgical site.  Ice packs or heating pads (30-60 minutes up to 6 times a day) will help. Some people prefer to use ice alone, heat alone, alternating between ice & heat.  Experiment to what works for you.  Swelling and bruising can take several weeks to resolve.   c. It is helpful to take an over-the-counter pain medication regularly for the first few weeks: i. Ibuprofen (Motrin/Advil) - 200mg  tabs - take 3 tabs (600mg ) every 6 hours as needed for pain ii. Acetaminophen (Tylenol) - you may take 650mg  every 6 hours as needed. You can take this with motrin as they act differently on the body. If you are taking a narcotic pain medication that has acetaminophen in it, do not take over the counter tylenol at the same time.  Iii. NOTE: You may take both of these medications together - most patients  find it most helpful when alternating between the two (i.e. Ibuprofen at 6am,  tylenol at 9am, ibuprofen at 12pm ...) d. A  prescription for pain medication should be given to you upon discharge.  Take your pain medication as prescribed if your pain is not adequatly controlled with the over-the-counter pain reliefs mentioned above.  4. Avoid getting constipated.  Between the surgery and the pain medications, it is common to experience some constipation.  Increasing fluid  intake and taking a fiber supplement (such as Metamucil, Citrucel, FiberCon, MiraLax, etc) 1-2 times a day regularly will usually help prevent this problem from occurring.  A mild laxative (prune juice, Milk of Magnesia, MiraLax, etc) should be taken according to package directions if there are no bowel movements after 48 hours.    5. Wound care: You may remove your wound packing tomorrow.  It is okay to bathe normally in the shower allowing soap and water to run through the wound.  At least once per day, change this dressing.  For dressing materials, it will be helpful to obtain these over-the-counter from your local pharmacy-4 x 4 gauze (square gauze ), ABD pad (large absorbant pad), tape for dressing that is gentle on the skin. The wound may be dressed with a moist 4 x 4 gauze, covered with additional 4 x 4 gauze, and then covered with an absorbent pad such as an ABD pad.  Some drainage is expected for the ensuing weeks.  If any bleeding is noted to be occurring, much like when the skin is cut when shaving, this may be treated by applying a dry piece of gauze and pressure for 10 to 15 minutes and the bleeding should subside. If ongoing bleeding that doesn't stop, you may contact our on call physician and/or return to the emergency room for evaluation. Avoid baths/pools/lakes/oceans until your wounds have fully healed.  6. ACTIVITIES as tolerated:   a. You may resume regular (light) daily activities beginning the  next day--such as daily self-care, walking, climbing stairs--gradually increasing activities as tolerated.  If you can walk 30 minutes without difficulty, it is safe to try more intense activity such as jogging, treadmill, bicycling, low-impact aerobics.  b. DO NOT PUSH THROUGH PAIN.  Let pain be your guide: If it hurts to do something, don't do it. c. You may drive when you are no longer taking prescription pain medication, you can comfortably wear a seatbelt, and you can safely maneuver your car  and apply brakes.  7. FOLLOW UP in our office a. Please call [facility] at [phone] to set up an appointment to see your surgeon in the office for a follow-up appointment approximately 2 weeks after your surgery. b. Make sure that you call for this appointment the day you arrive home to insure a convenient appointment time.  9. If you have disability or family leave forms that need to be completed, you may have them completed by your primary care physician's office; for return to work instructions, please ask our office staff and they will be happy to assist you in obtaining this documentation   When to call us [phone]: 1. Poor pain control 2. Reactions / problems with new medications (rash/itching, etc)  3. Fever over 101.5 F (38.5 C) 4. Inability to urinate 5. Nausea/vomiting 6. Worsening swelling or bruising 7. Continued bleeding from incision. 8. Increased pain, redness, or drainage from the incision  The clinic staff is available to answer your questions during regular business hours (8:30am-5pm).  Please don't hesitate to call and ask to speak to one of our nurses for clinical concerns.   A surgeon from [facility] is always on call at the hospitals   If you have a medical emergency, go to the nearest emergency room or call 911.  [facility] [address], [address], [place], [place]  [address] MAIN: [phone] FAX: [phone] [email or web address]

## 2020-02-05 NOTE — Op Note (Signed)
02/05/2020  1:01 PM  PATIENT:  Brett Barnett  29 y.o. male  Patient Care Team: Evelene Croon, MD as PCP - General (Family Medicine)  PRE-OPERATIVE DIAGNOSIS:  Pilonidal disease  POST-OPERATIVE DIAGNOSIS:  Same  PROCEDURE:  Excision of pilonidal disease  SURGEON:  Stephanie Coup. Andrius Andrepont, MD  ANESTHESIA:   local and general  COUNTS:  Sponge, needle and instrument counts were reported correct x2 at the conclusion of the operation.  EBL: 10 mL  DRAINS: None  SPECIMEN: Pilonidal disease - short suture superior, long suture patients left side  COMPLICATIONS: None  FINDINGS: Midline pits in superior most aspect of gluteal cleft consistent with pilonidal disease. Thickened fibrotic tissue and hair within. Excision of all disease down to fascia overlying sacrum was carried out with marsupialization technique to decrease wound size.  DISPOSITION: PACU in satisfactory condition  INDICATION: Brett Barnett is a very pleasant 28yoM whom presented to our office for evaluation of possible pilonidal disease.  He has history of abscess/purulent drainage in the location of the superiormost gluteal cleft.  He was seen and evaluated and found to have midline pits consistent with pilonidal disease.  We discussed options moving forward including further observation versus surgery which would involve excision of pilonidal disease.  We discussed everything at length with him.  We discussed recurrence rates of 20 to 30% with surgery.  We also spent time going over wound expectations and ongoing wound care.  Please refer to notes elsewhere for all details regarding these discussions.  He opted to pursue surgery.  DESCRIPTION: The patient was identified in preop holding and taken to the OR.  SCDs were then placed.  He underwent general endotracheal anesthesia without difficulty.  He was then positioned onto the operating table in the prone position.  Pressure points were then evaluated and padded.  Hair on the  back was clipped.  The upper buttock was gently taped apart.  The site was then prepped and draped in a standard sterile fashion. A surgical timeout was performed indicating the correct patient, procedure, positioning.  The area of disease was identified as they were midline pits at this location and additionally, firm fibrotic tissue around the pits.  The pits were then cannulated with a lacrimal probe and the superior and inferior margins of the cavity were identified.  The lateral edges were also identified.  These were all marked with a marking pen.  Excision of the involved skin and underlying subcutaneous tissue was then carried out using electrocautery.  The wound was carried down to the level of the fascia overlying the sacral bone to ensure that all disease was removed.  The area of excision measured 8 x 3 cm anterior depth that was the fascia overlying the sacral bone.  The wound cavity was then irrigated with saline.  Hemostasis was achieved with electrocautery.  The wound was reirrigated and hemostasis verified.  The tape was then released by the OR team.  To decrease the size of the wound, the skin edges were marsupialized down to the level of the fascia overlying the sacrum using 0 Vicryl sutures.  Hemostasis was verified.  The wound was then gently packed with a moist Kerlix, covered with 4 x 4's, covered with ABD.  This was secured with tape.  He was then rolled back into the supine position on a stretcher, awakened from anesthesia, extubated, and transferred to recovery in satisfactory condition.

## 2020-02-08 LAB — SURGICAL PATHOLOGY

## 2021-10-06 ENCOUNTER — Ambulatory Visit
Admission: EM | Admit: 2021-10-06 | Discharge: 2021-10-06 | Disposition: A | Discharge status: Home / Self Care | Payer: BC Managed Care – PPO | Attending: Medical Oncology | Admitting: Medical Oncology

## 2021-10-06 ENCOUNTER — Encounter: Payer: Self-pay | Admitting: Emergency Medicine

## 2021-10-06 ENCOUNTER — Other Ambulatory Visit: Payer: Self-pay

## 2021-10-06 DIAGNOSIS — J4520 Mild intermittent asthma, uncomplicated: Secondary | ICD-10-CM

## 2021-10-06 DIAGNOSIS — U071 COVID-19: Secondary | ICD-10-CM | POA: Diagnosis not present

## 2021-10-06 LAB — BASIC METABOLIC PANEL
Anion gap: 7 (ref 5–15)
BUN: 22 mg/dL — ABNORMAL HIGH (ref 6–20)
CO2: 24 mmol/L (ref 22–32)
Calcium: 9.1 mg/dL (ref 8.9–10.3)
Chloride: 102 mmol/L (ref 98–111)
Creatinine, Ser: 0.88 mg/dL (ref 0.61–1.24)
GFR, Estimated: >60 mL/min (ref >60)
Glucose, Bld: 95 mg/dL (ref 70–99)
Potassium: 4.3 mmol/L (ref 3.5–5.1)
Sodium: 133 mmol/L — ABNORMAL LOW (ref 135–145)

## 2021-10-06 MED ORDER — MOLNUPIRAVIR EUA 200MG CAPSULE: 4.0000 | ORAL_CAPSULE | Freq: Two times a day (BID) | ORAL | 0 refills | Status: AC

## 2021-10-06 MED ORDER — BENZONATATE 100 MG PO CAPS: 100.0000 mg | ORAL_CAPSULE | Freq: Three times a day (TID) | ORAL | 0 refills | Status: AC

## 2021-10-06 MED ORDER — FLUTICASONE PROPIONATE 50 MCG/ACT NA SUSP: 2.0000 | Freq: Every day | NASAL | 0 refills | Status: AC

## 2021-10-06 MED ORDER — ALBUTEROL SULFATE HFA 108 (90 BASE) MCG/ACT IN AERS: 1.0000 | INHALATION_SPRAY | Freq: Four times a day (QID) | RESPIRATORY_TRACT | 0 refills | Status: AC | PRN

## 2021-10-06 NOTE — ED Provider Notes (Signed)
[facility]    CSN: [id number] Arrival date & time: 10/06/21  1212      History   Chief Complaint Chief Complaint  Patient presents with   Covid Positive   Generalized Body Aches   Cough    HPI Brett Barnett is a 31 y.o. male.   HPI  COVID-19: Patient reports that this morning he started having dry cough, mild SOB, body aches, chills, diarrhea, sore throat and nasal congestion. Tested at [facility] and was found to be positive for COVID-19 (he brings in the results). He would like anti-viral medication given his HTH and asthma. He currently has not tried anything for symptoms. No sick contacts. No chest pain, significant SOB, fevers.   Past Medical History:  Diagnosis Date   Asthma    Hypertension     There are no problems to display for this patient.   Past Surgical History:  Procedure Laterality Date   PILONIDAL CYST EXCISION N/A 02/05/2020   Procedure: EXCISION OF PILONIDAL DISEASE;  Surgeon: Ileana Roup, MD;  Location: [facility];  Service: General;  Laterality: N/A;   TONSILLECTOMY         Home Medications    Prior to Admission medications   Medication Sig Start Date End Date Taking? Authorizing Provider  albuterol (VENTOLIN HFA) 108 (90 Base) MCG/ACT inhaler Inhale 1-2 puffs into the lungs every 6 (six) hours as needed. 10/06/21  Yes Madalaine Portier M, PA-C  benzonatate (TESSALON) 100 MG capsule Take 1 capsule (100 mg total) by mouth every 8 (eight) hours. 10/06/21  Yes Hester Forget M, PA-C  fluticasone [facility]) 50 MCG/ACT nasal spray Place 2 sprays into both nostrils daily. 10/06/21  Yes Kyrene Longan M, PA-C  molnupiravir EUA (LAGEVRIO) 200 mg CAPS capsule Take 4 capsules (800 mg total) by mouth 2 (two) times daily for 5 days. 10/06/21 10/11/21 Yes Nakota Ackert M, PA-C  ibuprofen (ADVIL) 200 MG tablet Take 600 mg by mouth every 8 (eight) hours as needed (for pain.).    [provider]    Family History History  reviewed. No pertinent family history.  Social History Social History   Tobacco Use   Smoking status: Never   Smokeless tobacco: Never  Vaping Use   Vaping Use: Every day  Substance Use Topics   Alcohol use: Yes    Comment: occas.   Drug use: Never     Allergies   Patient has no known allergies.   Review of Systems Review of Systems  As stated above in HPI Physical Exam Triage Vital Signs ED Triage Vitals  Enc Vitals Group     BP 10/06/21 1224 (!) 144/84     Pulse Rate 10/06/21 1224 96     Resp 10/06/21 1224 16     Temp 10/06/21 1224 97.7 F (36.5 C)     Temp Source 10/06/21 1224 Oral     SpO2 10/06/21 1224 97 %     Weight 10/06/21 1222 (!) 350 lb 1.5 oz (158.8 kg)     Height 10/06/21 1222 5\' 7"  (1.702 m)     Head Circumference --      Peak Flow --      Pain Score 10/06/21 1222 7     Pain Loc --      Pain Edu? --      Excl. in [place]? --    No data found.  Updated Vital Signs BP (!) 144/84 (BP Location: Left Arm)    Pulse  96    Temp 97.7 F (36.5 C) (Oral)    Resp 16    Ht 5\' 7"  (1.702 m)    Wt (!) 350 lb 1.5 oz (158.8 kg)    SpO2 97%    BMI 54.83 kg/m    Physical Exam Vitals and nursing note reviewed.  Constitutional:      General: He is not in acute distress.    Appearance: Normal appearance. He is not ill-appearing, toxic-appearing or diaphoretic.  HENT:     Head: Normocephalic and atraumatic.     Right Ear: Tympanic membrane normal.     Left Ear: Tympanic membrane normal.     Nose: Congestion and rhinorrhea present.     Mouth/Throat:     Mouth: Mucous membranes are moist.     Pharynx: Oropharynx is clear. No oropharyngeal exudate or posterior oropharyngeal erythema.  Eyes:     General:        Right eye: No discharge.        Left eye: No discharge.     Extraocular Movements: Extraocular movements intact.     Conjunctiva/sclera: Conjunctivae normal.     Pupils: Pupils are equal, round, and reactive to light.  Cardiovascular:     Rate and Rhythm:  Normal rate and regular rhythm.     Pulses: Normal pulses.     Heart sounds: Normal heart sounds.  Pulmonary:     Effort: Pulmonary effort is normal.     Breath sounds: Normal breath sounds.  Abdominal:     Palpations: Abdomen is soft.  Musculoskeletal:     Cervical back: Normal range of motion and neck supple.  Lymphadenopathy:     Cervical: No cervical adenopathy.  Skin:    General: Skin is warm.     Coloration: Skin is not jaundiced or pale.  Neurological:     Mental Status: He is alert and oriented to person, place, and time.     UC Treatments / Results  Labs (all labs ordered are listed, but only abnormal results are displayed) Labs Reviewed  BASIC METABOLIC PANEL    EKG   Radiology No results found.  Procedures Procedures (including critical care time)  Medications Ordered in UC Medications - No data to display  Initial Impression / Assessment and Plan / UC Course  I have reviewed the triage vital signs and the nursing notes.  Pertinent labs & imaging results that were available during my care of the patient were reviewed by me and considered in my medical decision making (see chart for details).     New. Discussed antiviral medication risks and benefits. He elects to proceed forward with the medication. BMP pending. Also sending in albuterol, tessalon, flonase. Discussed rest, hydration and general guidelines for COVID-19. Discussed red flag signs and symptoms. Follow up PRN.  Final Clinical Impressions(s) / UC Diagnoses   Final diagnoses:  COVID-19  Mild intermittent asthma, unspecified whether complicated   Discharge Instructions   None    ED Prescriptions     Medication Sig Dispense Auth. Provider   albuterol (VENTOLIN HFA) 108 (90 Base) MCG/ACT inhaler Inhale 1-2 puffs into the lungs every 6 (six) hours as needed. 1 each Rease Swinson M, PA-C   molnupiravir EUA (LAGEVRIO) 200 mg CAPS capsule Take 4 capsules (800 mg total) by mouth 2 (two)  times daily for 5 days. 40 capsule Azhar Yogi M, PA-C   benzonatate (TESSALON) 100 MG capsule Take 1 capsule (100 mg total) by mouth every 8 (eight) hours.  21 capsule Viann Nielson M, PA-C   fluticasone [facility]) 50 MCG/ACT nasal spray Place 2 sprays into both nostrils daily. 16 mL Hughie Closs, [place]      PDMP not reviewed this encounter.   Hughie Closs, [place] 10/06/21 1327

## 2021-10-06 NOTE — ED Triage Notes (Signed)
Patient c/o cough, bodyaches, chills, diarrhea that started this morning.  Patient states that he had a covid test at [facility] store and was positive.

## 2023-02-03 ENCOUNTER — Ambulatory Visit
Admission: EM | Admit: 2023-02-03 | Discharge: 2023-02-03 | Disposition: A | Discharge status: Home / Self Care | Payer: BC Managed Care – PPO | Attending: Family Medicine | Admitting: Family Medicine

## 2023-02-03 ENCOUNTER — Encounter: Payer: Self-pay | Admitting: Emergency Medicine

## 2023-02-03 DIAGNOSIS — R140 Abdominal distension (gaseous): Secondary | ICD-10-CM | POA: Diagnosis present

## 2023-02-03 DIAGNOSIS — R1084 Generalized abdominal pain: Secondary | ICD-10-CM | POA: Insufficient documentation

## 2023-02-03 LAB — COMPREHENSIVE METABOLIC PANEL
ALT: 31 U/L (ref 0–44)
AST: 26 U/L (ref 15–41)
Albumin: 4.4 g/dL (ref 3.5–5.0)
Alkaline Phosphatase: 35 U/L — ABNORMAL LOW (ref 38–126)
Anion gap: 9 (ref 5–15)
BUN: 13 mg/dL (ref 6–20)
CO2: 21 mmol/L — ABNORMAL LOW (ref 22–32)
Calcium: 8.8 mg/dL — ABNORMAL LOW (ref 8.9–10.3)
Chloride: 102 mmol/L (ref 98–111)
Creatinine, Ser: 0.82 mg/dL (ref 0.61–1.24)
GFR, Estimated: >60 mL/min (ref >60)
Glucose, Bld: 103 mg/dL — ABNORMAL HIGH (ref 70–99)
Potassium: 3.7 mmol/L (ref 3.5–5.1)
Sodium: 132 mmol/L — ABNORMAL LOW (ref 135–145)
Total Bilirubin: 0.6 mg/dL (ref 0.3–1.2)
Total Protein: 7.8 g/dL (ref 6.5–8.1)

## 2023-02-03 LAB — CBC WITH DIFFERENTIAL/PLATELET
Abs Immature Granulocytes: 0.02 10*3/uL (ref 0.00–0.07)
Basophils Absolute: 0 10*3/uL (ref 0.0–0.1)
Basophils Relative: 1 %
Eosinophils Absolute: 0.2 10*3/uL (ref 0.0–0.5)
Eosinophils Relative: 2 %
HCT: 40.2 % (ref 39.0–52.0)
Hemoglobin: 14.5 g/dL (ref 13.0–17.0)
Immature Granulocytes: 0 %
Lymphocytes Relative: 26 %
Lymphs Abs: 1.8 10*3/uL (ref 0.7–4.0)
MCH: 30.1 pg (ref 26.0–34.0)
MCHC: 36.1 g/dL — ABNORMAL HIGH (ref 30.0–36.0)
MCV: 83.6 fL (ref 80.0–100.0)
Monocytes Absolute: 0.4 10*3/uL (ref 0.1–1.0)
Monocytes Relative: 7 %
Neutro Abs: 4.3 10*3/uL (ref 1.7–7.7)
Neutrophils Relative %: 64 %
Platelets: 208 10*3/uL (ref 150–400)
RBC: 4.81 MIL/uL (ref 4.22–5.81)
RDW: 13.4 % (ref 11.5–15.5)
WBC: 6.6 10*3/uL (ref 4.0–10.5)
nRBC: 0 % (ref 0.0–0.2)

## 2023-02-03 MED ORDER — ONDANSETRON 4 MG PO TBDP: 4.0000 mg | ORAL_TABLET | Freq: Three times a day (TID) | ORAL | 0 refills | Status: AC | PRN

## 2023-02-03 MED ORDER — OMEPRAZOLE 20 MG PO CPDR: 40.0000 mg | DELAYED_RELEASE_CAPSULE | Freq: Every day | ORAL | 0 refills | Status: AC

## 2023-02-03 MED ORDER — SIMETHICONE 80 MG PO CHEW: 80.0000 mg | CHEWABLE_TABLET | Freq: Four times a day (QID) | ORAL | 0 refills | Status: AC | PRN

## 2023-02-03 NOTE — Discharge Instructions (Addendum)
Stop by the pharmacy to pick up your prescriptions.  Follow-up with your cardiologist as discussed.  If you start having chest pain with shortness of breath, vomiting and/or sweating go to the emergency department

## 2023-02-03 NOTE — ED Triage Notes (Signed)
Patient reports vomiting and dizziness that started yesterday.  Patient states that he saw his Cardiologist last  week and his blood pressure medicine was increased.  Patient N/D.  Patient unsure of fevers.  Patient also reports that his BP spikes off on for the past couple of weeks.

## 2023-02-03 NOTE — ED Provider Notes (Signed)
[facility]    CSN: [id number] Arrival date & time: 02/03/23  1408      History   Chief Complaint Chief Complaint  Patient presents with   Emesis   Hypertension    HPI Brett Barnett is a 32 y.o. male.   HPI   Brett Barnett presents for vomiting and elevated blood pressure. 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  They removed his gallbladder last September. He breaks out in a sweat when he vomiting. He occasionally has chest pain.   Takes amlodine 10 mg, lisinopri 10 mg and HCTZ 12.5 mg. BP 198/148. He say cardiology and they have scheduled for an ECHO.      Fever : no  Chills: no Sore throat: no   Cough: no Sputum: no Nasal congestion : no  Rhinorrhea: no Myalgias: no Appetite: normal  Hydration: normal  Abdominal pain: no Nausea: no Vomiting: no Sleep disturbance: no Headache: no      Past Medical History:  Diagnosis Date   Asthma    Hypertension     There are no problems to display for this patient.   Past Surgical History:  Procedure Laterality Date   PILONIDAL CYST EXCISION N/A 02/05/2020   Procedure: EXCISION OF PILONIDAL DISEASE;  Surgeon: Andria Meuse, MD;  Location: [facility];  Service: General;  Laterality: N/A;   TONSILLECTOMY         Home Medications    Prior to Admission medications   Medication Sig Start Date End Date Taking? Authorizing Provider  lisinopril-hydrochlorothiazide (ZESTORETIC) 10-12.5 MG tablet Take 1 tablet by mouth daily. 01/18/23  Yes [provider]  albuterol (VENTOLIN HFA) 108 (90 Base) MCG/ACT inhaler Inhale 1-2 puffs into the lungs every 6 (six) hours as needed. 10/06/21   Brett Chestnut, PA-C  amLODipine (NORVASC) 5 MG tablet Take 10 mg by mouth daily.    [provider]  benzonatate (TESSALON) 100 MG capsule Take 1 capsule (100 mg total) by mouth every 8 (eight) hours. 10/06/21   Brett Chestnut, PA-C  fluticasone (FLONASE) 50  MCG/ACT nasal spray Place 2 sprays into both nostrils daily. 10/06/21   Brett Chestnut, PA-C  ibuprofen (ADVIL) 200 MG tablet Take 600 mg by mouth every 8 (eight) hours as needed (for pain.).    [provider]    Family History History reviewed. No pertinent family history.  Social History Social History   Tobacco Use   Smoking status: Never   Smokeless tobacco: Never  Vaping Use   Vaping Use: Every day  Substance Use Topics   Alcohol use: Yes    Comment: occas.   Drug use: Never     Allergies   Patient has no known allergies.   Review of Systems Review of Systems: negative unless otherwise stated in HPI.      Physical Exam Triage Vital Signs ED Triage Vitals  Enc Vitals Group     BP 02/03/23 1450 132/79     Pulse Rate 02/03/23 1450 71     Resp 02/03/23 1450 16     Temp 02/03/23 1450 97.7 F (36.5 C)     Temp Source 02/03/23 1450 Oral     SpO2 02/03/23 1450 100 %     Weight 02/03/23 1448 (!) 350 lb 1.5 oz (158.8 kg)     Height 02/03/23 1448 5\' 7"  (1.702 m)     Head Circumference --  Peak Flow --      Pain Score 02/03/23 1448 0     Pain Loc --      Pain Edu? --      Excl. in [place]? --    No data found.  Updated Vital Signs BP 132/79 (BP Location: Left Arm)   Pulse 71   Temp 97.7 F (36.5 C) (Oral)   Resp 16   Ht 5\' 7"  (1.702 m)   Wt (!) 158.8 kg   SpO2 100%   BMI 54.83 kg/m   Visual Acuity Right Eye Distance:   Left Eye Distance:   Bilateral Distance:    Right Eye Near:   Left Eye Near:    Bilateral Near:     Physical Exam GEN:     alert, cooperative and no distress ***   HENT:  mucus membranes moist, oropharyngeal without lesions or erythema ***,  nares patent, no nasal discharge *** EYES:   pupils equal and reactive, EOM intact NECK:  supple, normal ROM, no lymphadenopathy *** RESP:  clear to auscultation bilaterally, no increased work of breathing *** CVS:   regular rate and rhythm, no murmur, distal pulses intact   *** ABD:  soft, non-tender; bowel sounds present; no palpable masses,  *** EXT:   normal ROM, atraumatic, no edema *** NEURO:  normal without focal findings,  speech normal, alert and oriented  *** Skin:   warm and dry, no rash***, normal*** skin turgor Psych: Normal affect, appropriate speech and behavior      UC Treatments / Results  Labs (all labs ordered are listed, but only abnormal results are displayed) Labs Reviewed  CBC WITH DIFFERENTIAL/PLATELET - Abnormal; Notable for the following components:      Result Value   MCHC 36.1 (*)    All other components within normal limits  COMPREHENSIVE METABOLIC PANEL    EKG  If EKG performed, see my interpretation in the MDM section  Radiology No results found.   Procedures Procedures (including critical care time)  Medications Ordered in UC Medications - No data to display  Initial Impression / Assessment and Plan / UC Course  I have reviewed the triage vital signs and the nursing notes.  Pertinent labs & imaging results that were available during my care of the patient were reviewed by me and considered in my medical decision making (see chart for details).       Patient is a 31 y.o. male  who presents for ***.  Overall patient is nontoxic-appearing and ***afebrile.  Vital signs stable.   ED and return precautions given and patient/guardian voiced understanding. Discussed MDM, treatment plan and plan for follow-up with patient*** who agrees with plan.     Discussed MDM, treatment plan and plan for follow-up with patient***who agrees with plan.   Final Clinical Impressions(s) / UC Diagnoses   Final diagnoses:  None   Discharge Instructions   None    ED Prescriptions   None    PDMP not reviewed this encounter.

## 2023-02-14 ENCOUNTER — Other Ambulatory Visit: Payer: Self-pay | Admitting: Internal Medicine

## 2023-02-14 DIAGNOSIS — R079 Chest pain, unspecified: Secondary | ICD-10-CM

## 2023-03-14 ENCOUNTER — Telehealth (HOSPITAL_COMMUNITY): Payer: Self-pay | Admitting: Emergency Medicine

## 2023-03-14 NOTE — Telephone Encounter (Signed)
Calling to let patient know that we cannot perform his CCTA due to his BMI 62. I wanted to explain that his body habitus would create non-diagnostic images and we do not want to risk exposing him to contrast and radiation if its going to produce poor image quality.   LMTCB  Rockwell Alexandria RN Navigator Cardiac Imaging [facility] [phone] Office  [phone] Cell

## 2023-03-18 ENCOUNTER — Ambulatory Visit: Admission: RE | Admit: 2023-03-18 | Payer: BC Managed Care – PPO | Source: Ambulatory Visit

## 2023-05-16 ENCOUNTER — Other Ambulatory Visit: Payer: Self-pay | Admitting: Neurology

## 2023-05-16 DIAGNOSIS — R42 Dizziness and giddiness: Secondary | ICD-10-CM

## 2023-05-29 ENCOUNTER — Encounter: Payer: Self-pay | Admitting: Neurology

## 2023-05-31 ENCOUNTER — Ambulatory Visit
Admission: RE | Admit: 2023-05-31 | Discharge: 2023-05-31 | Disposition: A | Discharge status: Home / Self Care | Payer: BC Managed Care – PPO | Source: Ambulatory Visit | Attending: Neurology | Admitting: Neurology

## 2023-05-31 DIAGNOSIS — R42 Dizziness and giddiness: Secondary | ICD-10-CM

## 2024-04-01 ENCOUNTER — Other Ambulatory Visit: Payer: Self-pay | Admitting: Cardiovascular Disease

## 2024-04-01 DIAGNOSIS — R1011 Right upper quadrant pain: Secondary | ICD-10-CM

## 2024-07-02 ENCOUNTER — Other Ambulatory Visit: Payer: Self-pay

## 2024-07-02 ENCOUNTER — Ambulatory Visit
Admission: RE | Admit: 2024-07-02 | Discharge: 2024-07-02 | Disposition: A | Discharge status: Home / Self Care | Attending: Gastroenterology | Admitting: Gastroenterology

## 2024-07-02 ENCOUNTER — Ambulatory Visit: Admitting: Anesthesiology

## 2024-07-02 ENCOUNTER — Encounter: Payer: Self-pay | Admitting: Gastroenterology

## 2024-07-02 ENCOUNTER — Encounter: Admission: RE | Disposition: A | Payer: Self-pay | Attending: Gastroenterology

## 2024-07-02 DIAGNOSIS — K219 Gastro-esophageal reflux disease without esophagitis: Secondary | ICD-10-CM | POA: Insufficient documentation

## 2024-07-02 DIAGNOSIS — Z6841 Body Mass Index (BMI) 40.0 and over, adult: Secondary | ICD-10-CM | POA: Diagnosis not present

## 2024-07-02 DIAGNOSIS — K2950 Unspecified chronic gastritis without bleeding: Secondary | ICD-10-CM | POA: Insufficient documentation

## 2024-07-02 DIAGNOSIS — E6689 Other obesity not elsewhere classified: Secondary | ICD-10-CM | POA: Insufficient documentation

## 2024-07-02 DIAGNOSIS — F17200 Nicotine dependence, unspecified, uncomplicated: Secondary | ICD-10-CM | POA: Insufficient documentation

## 2024-07-02 DIAGNOSIS — K2100 Gastro-esophageal reflux disease with esophagitis, without bleeding: Secondary | ICD-10-CM | POA: Diagnosis present

## 2024-07-02 DIAGNOSIS — I10 Essential (primary) hypertension: Secondary | ICD-10-CM | POA: Insufficient documentation

## 2024-07-02 HISTORY — PX: ESOPHAGOGASTRODUODENOSCOPY: SHX5428

## 2024-07-02 SURGERY — EGD (ESOPHAGOGASTRODUODENOSCOPY)
Anesthesia: General

## 2024-07-02 MED ORDER — DEXMEDETOMIDINE HCL IN NACL 80 MCG/20ML IV SOLN
INTRAVENOUS | Status: DC | PRN
  Administered 2024-07-02: 12 ug via INTRAVENOUS
  Administered 2024-07-02: 8 ug via INTRAVENOUS

## 2024-07-02 MED ORDER — GLYCOPYRROLATE 0.2 MG/ML IJ SOLN
INTRAMUSCULAR | Status: DC | PRN
  Administered 2024-07-02: .2 mg via INTRAVENOUS

## 2024-07-02 MED ORDER — DEXMEDETOMIDINE HCL IN NACL 80 MCG/20ML IV SOLN
INTRAVENOUS | Status: AC
  Filled 2024-07-02: qty 20

## 2024-07-02 MED ORDER — PROPOFOL 500 MG/50ML IV EMUL
INTRAVENOUS | Status: DC | PRN
  Administered 2024-07-02: 75 ug/kg/min via INTRAVENOUS

## 2024-07-02 MED ORDER — GLYCOPYRROLATE 0.2 MG/ML IJ SOLN
INTRAMUSCULAR | Status: AC
  Filled 2024-07-02: qty 1

## 2024-07-02 MED ORDER — LIDOCAINE HCL (CARDIAC) PF 100 MG/5ML IV SOSY
PREFILLED_SYRINGE | INTRAVENOUS | Status: DC | PRN
  Administered 2024-07-02: 100 mg via INTRAVENOUS

## 2024-07-02 MED ORDER — LIDOCAINE HCL (PF) 2 % IJ SOLN
INTRAMUSCULAR | Status: AC
  Filled 2024-07-02: qty 5

## 2024-07-02 MED ORDER — PROPOFOL 10 MG/ML IV BOLUS
INTRAVENOUS | Status: DC | PRN
  Administered 2024-07-02 (×2): 50 mg via INTRAVENOUS

## 2024-07-02 MED ORDER — SODIUM CHLORIDE 0.9 % IV SOLN
INTRAVENOUS | Status: DC
Start: 2024-07-02 — End: 2024-07-02

## 2024-07-02 NOTE — Anesthesia Postprocedure Evaluation (Signed)
 Anesthesia Post Note  Patient: Brett Barnett  Procedure(s) Performed: EGD (ESOPHAGOGASTRODUODENOSCOPY)  Patient location during evaluation: PACU Anesthesia Type: General Level of consciousness: awake Pain management: satisfactory to patient Vital Signs Assessment: post-procedure vital signs reviewed and stable Respiratory status: spontaneous breathing Cardiovascular status: stable Anesthetic complications: no   No notable events documented.   Last Vitals:  Vitals:   07/02/24 1057 07/02/24 1107  BP: (!) 106/59 112/71  Pulse: 73 63  Resp: 13 13  Temp: (!) 36.1 C   SpO2: 97% 97%    Last Pain:  Vitals:   07/02/24 1107  TempSrc:   PainSc: 0-No pain                 VAN STAVEREN,Eddison Searls

## 2024-07-02 NOTE — Op Note (Signed)
 [facility] Patient Name: Brett Barnett Procedure Date: 07/02/2024 10:34 AM MRN: [id number] Account #: [id number] Date of Birth: July 12, 1991 Admit Type: Outpatient Age: 33 Room: [facility] ENDO ROOM 1 Gender: Male Note Status: Finalized Instrument Name: Upper GI Scope [id number] Procedure:             Upper GI endoscopy Indications:           Epigastric abdominal pain, Dyspepsia, Follow-up of                         gastro-esophageal reflux disease Providers:             Corinn Jess Brooklyn MD, MD Referring MD:          Corinn Jess Brooklyn MD, MD (Referring MD), Alm Needle (Referring MD) Medicines:             General Anesthesia Complications:         No immediate complications. Estimated blood loss: None. Procedure:             Pre-Anesthesia Assessment:                        - Prior to the procedure, a History and Physical was                         performed, and patient medications and allergies were                         reviewed. The patient is competent. The risks and                         benefits of the procedure and the sedation options and                         risks were discussed with the patient. All questions                         were answered and informed consent was obtained.                         Patient identification and proposed procedure were                         verified by the physician, the nurse, the                         anesthesiologist, the anesthetist and the technician                         in the pre-procedure area in the procedure room in the                         endoscopy suite. Mental Status Examination: alert and                         oriented. Airway Examination: normal oropharyngeal  airway and neck mobility. Respiratory Examination:                         clear to auscultation. CV Examination: normal.                         Prophylactic  Antibiotics: The patient does not require                         prophylactic antibiotics. Prior Anticoagulants: The                         patient has taken no anticoagulant or antiplatelet                         agents. ASA Grade Assessment: III - A patient with                         severe systemic disease. After reviewing the risks and                         benefits, the patient was deemed in satisfactory                         condition to undergo the procedure. The anesthesia                         plan was to use general anesthesia. Immediately prior                         to administration of medications, the patient was                         re-assessed for adequacy to receive sedatives. The                         heart rate, respiratory rate, oxygen saturations,                         blood pressure, adequacy of pulmonary ventilation, and                         response to care were monitored throughout the                         procedure. The physical status of the patient was                         re-assessed after the procedure.                        After obtaining informed consent, the endoscope was                         passed under direct vision. Throughout the procedure,                         the patient's blood pressure, pulse, and oxygen  saturations were monitored continuously. The Endoscope                         was introduced through the mouth, and advanced to the                         second part of duodenum. The upper GI endoscopy was                         accomplished without difficulty. The patient tolerated                         the procedure well. Findings:      The duodenal bulb and second portion of the duodenum were normal.       Biopsies were taken with a cold forceps for histology.      The entire examined stomach was normal. Biopsies were taken with a cold       forceps for histology.       Esophagogastric landmarks were identified: the gastroesophageal junction       was found at 36 cm from the incisors.      The gastroesophageal junction and examined esophagus were normal.       Biopsies were taken with a cold forceps for histology. Impression:            - Normal duodenal bulb and second portion of the                         duodenum. Biopsied.                        - Normal stomach. Biopsied.                        - Esophagogastric landmarks identified.                        - Normal gastroesophageal junction and esophagus.                         Biopsied. Recommendation:        - Await pathology results.                        - Discharge patient to home (with escort).                        - Resume previous diet today.                        - Continue present medications. Procedure Code(s):     --- Professional ---                        [phone], Esophagogastroduodenoscopy, flexible,                         transoral; with biopsy, single or multiple Diagnosis Code(s):     --- Professional ---                        R10.13, Epigastric pain  K21.9, Gastro-esophageal reflux disease without                         esophagitis CPT copyright 2022 American Medical Association. All rights reserved. The codes documented in this report are preliminary and upon coder review may  be revised to meet current compliance requirements. Dr. Corinn Brooklyn Corinn Jess Brooklyn MD, MD 07/02/2024 10:55:11 AM This report has been signed electronically. Number of Addenda: 0 Note Initiated On: 07/02/2024 10:34 AM Estimated Blood Loss:  Estimated blood loss: none.      [facility]

## 2024-07-02 NOTE — Transfer of Care (Signed)
 Immediate Anesthesia Transfer of Care Note  Patient: Brett Barnett  Procedure(s) Performed: EGD (ESOPHAGOGASTRODUODENOSCOPY)  Patient Location: PACU  Anesthesia Type:General  Level of Consciousness: sedated  Airway & Oxygen Therapy: Patient Spontanous Breathing  Post-op Assessment: Report given to RN and Post -op Vital signs reviewed and stable  Post vital signs: Reviewed and stable  Last Vitals:  Vitals Value Taken Time  BP 106/59 07/02/24 10:57  Temp 36.1 C 07/02/24 10:57  Pulse 73 07/02/24 10:58  Resp 12 07/02/24 10:58  SpO2 96 % 07/02/24 10:58  Vitals shown include unfiled device data.  Last Pain:  Vitals:   07/02/24 1057  TempSrc: Temporal  PainSc: Asleep         Complications: No notable events documented.

## 2024-07-02 NOTE — H&P (Signed)
 Brett JONELLE Brooklyn, MD [facility], DHIP [address]  [place], [place] [address]  Main: [phone] Fax:  [phone] Pager: [phone]   Primary Care Physician:  Epifanio Alm SQUIBB, MD Primary Gastroenterologist:  Dr. Corinn JONELLE Barnett  Pre-Procedure History & Physical: HPI:  Brett Barnett is a 33 y.o. male is here for an endoscopy.   Past Medical History:  Diagnosis Date   Asthma    Hypertension     Past Surgical History:  Procedure Laterality Date   PILONIDAL CYST EXCISION N/A 02/05/2020   Procedure: EXCISION OF PILONIDAL DISEASE;  Surgeon: Teresa Lonni HERO, MD;  Location: [facility];  Service: General;  Laterality: N/A;   TONSILLECTOMY      Prior to Admission medications   Medication Sig Start Date End Date Taking? Authorizing Provider  albuterol  (VENTOLIN  HFA) 108 (90 Base) MCG/ACT inhaler Inhale 1-2 puffs into the lungs every 6 (six) hours as needed. 10/06/21   Tonette Lauraine HERO, PA-C  amLODipine (NORVASC) 5 MG tablet Take 10 mg by mouth daily.    [provider]  benzonatate  (TESSALON ) 100 MG capsule Take 1 capsule (100 mg total) by mouth every 8 (eight) hours. 10/06/21   Tonette Lauraine HERO, PA-C  fluticasone  (FLONASE ) 50 MCG/ACT nasal spray Place 2 sprays into both nostrils daily. 10/06/21   Tonette Lauraine HERO, PA-C  ibuprofen  (ADVIL ) 200 MG tablet Take 600 mg by mouth every 8 (eight) hours as needed (for pain.).    [provider]  lisinopril-hydrochlorothiazide (ZESTORETIC) 10-12.5 MG tablet Take 1 tablet by mouth daily. 01/18/23   [provider]  omeprazole  (PRILOSEC) 20 MG capsule Take 2 capsules (40 mg total) by mouth daily. 02/03/23   Brimage, Vondra, DO  ondansetron  (ZOFRAN -ODT) 4 MG disintegrating tablet Take 1 tablet (4 mg total) by mouth every 8 (eight) hours as needed. 02/03/23   Brimage, Vondra, DO  simethicone  (GAS-X) 80 MG chewable tablet Chew 1 tablet (80 mg total) by mouth every 6 (six) hours as needed for  flatulence. 02/03/23   Brimage, Vondra, DO    Allergies as of 06/27/2024   (No Known Allergies)    No family history on file.  Social History   Socioeconomic History   Marital status: Single    Spouse name: Not on file   Number of children: Not on file   Years of education: Not on file   Highest education level: Not on file  Occupational History   Not on file  Tobacco Use   Smoking status: Never   Smokeless tobacco: Never  Vaping Use   Vaping status: Every Day  Substance and Sexual Activity   Alcohol use: Yes    Comment: occas.   Drug use: Never   Sexual activity: Not on file  Other Topics Concern   Not on file  Social History Narrative   Not on file   Social Drivers of Health   Financial Resource Strain: Low Risk  (06/08/2024)   Received from [facility]   Overall Financial Resource Strain (CARDIA)    Difficulty of Paying Living Expenses: Not hard at all  Food Insecurity: No Food Insecurity (06/08/2024)   Received from [facility]   Hunger Vital Sign    Within the past 12 months, you worried that your food would run out before you got the money to buy more.: Never true    Within the past 12 months, the food you bought just didn't last and you didn't  have money to get more.: Never true  Transportation Needs: No Transportation Needs (06/08/2024)   Received from [facility]    In the past 12 months, has lack of transportation kept you from medical appointments or from getting medications?: No    Lack of Transportation (Non-Medical): No  Physical Activity: Not on file  Stress: Not on file  Social Connections: Not on file  Intimate Partner Violence: Not on file    Review of Systems: See HPI, otherwise negative ROS  Physical Exam: BP 124/65   Pulse 70   Temp (!) 97.2 F (36.2 C) (Tympanic)   Resp 18   Ht 5' 7 (1.702 m)   Wt (!) 151.6 kg   SpO2 100%   BMI 52.34 kg/m  General:    Alert,  pleasant and cooperative in NAD Head:  Normocephalic and atraumatic. Neck:  Supple; no masses or thyromegaly. Lungs:  Clear throughout to auscultation.    Heart:  Regular rate and rhythm. Abdomen:  Soft, nontender and nondistended. Normal bowel sounds, without guarding, and without rebound.   Neurologic:  Alert and  oriented x4;  grossly normal neurologically.  Impression/Plan: Brett Barnett is here for an endoscopy to be performed for Gastroesophageal reflux symptoms with recurrent vomiting and epigastric pain   Risks, benefits, limitations, and alternatives regarding  endoscopy have been reviewed with the patient.  Questions have been answered.  All parties agreeable.   Brett Brooklyn, MD  07/02/2024, 10:13 AM

## 2024-07-02 NOTE — Anesthesia Preprocedure Evaluation (Signed)
 Anesthesia Evaluation  Patient identified by MRN, date of birth, ID band Patient awake    Reviewed: Allergy & Precautions, NPO status , Patient's Chart, lab work & pertinent test results  Airway Mallampati: III  TM Distance: >3 FB Neck ROM: Full    Dental  (+) Teeth Intact   Pulmonary neg pulmonary ROS, Current Smoker and Patient abstained from smoking.   Pulmonary exam normal  + decreased breath sounds      Cardiovascular Exercise Tolerance: Good hypertension, Pt. on medications negative cardio ROS Normal cardiovascular exam Rhythm:Regular Rate:Normal     Neuro/Psych negative neurological ROS  negative psych ROS   GI/Hepatic negative GI ROS, Neg liver ROS,,,  Endo/Other  negative endocrine ROS  Class 4 obesity  Renal/GU negative Renal ROS  negative genitourinary   Musculoskeletal negative musculoskeletal ROS (+)    Abdominal  (+) + obese  Peds negative pediatric ROS (+)  Hematology negative hematology ROS (+)   Anesthesia Other Findings Past Medical History: No date: Asthma No date: Hypertension  Past Surgical History: 02/05/2020: PILONIDAL CYST EXCISION; N/A     Comment:  Procedure: EXCISION OF PILONIDAL DISEASE;  Surgeon:               Teresa Lonni HERO, MD;  Location: [facility];  Service:               General;  Laterality: N/A; No date: TONSILLECTOMY  BMI    Body Mass Index: 52.34 kg/m      Reproductive/Obstetrics negative OB ROS                              Anesthesia Physical Anesthesia Plan  ASA: 3  Anesthesia Plan: General   Post-op Pain Management:    Induction: Intravenous  PONV Risk Score and Plan: Propofol  infusion and TIVA  Airway Management Planned: Natural Airway and Nasal Cannula  Additional Equipment:   Intra-op Plan:   Post-operative Plan:   Informed Consent: I have reviewed the patients History and Physical, chart, labs and discussed the  procedure including the risks, benefits and alternatives for the proposed anesthesia with the patient or authorized representative who has indicated his/her understanding and acceptance.     Dental Advisory Given  Plan Discussed with: CRNA  Anesthesia Plan Comments:         Anesthesia Quick Evaluation

## 2024-07-06 LAB — SURGICAL PATHOLOGY

## 2024-07-07 ENCOUNTER — Ambulatory Visit: Payer: Self-pay | Admitting: Gastroenterology

## 2024-07-07 NOTE — Progress Notes (Signed)
 Pathology results from upper endoscopy came back normal  RV
